# Patient Record
Sex: Female | Born: 1965 | Race: Black or African American | Hispanic: No | Marital: Single | Smoking: Never smoker
Health system: Southern US, Community
[De-identification: ages and names within clinical notes are randomized; demographics above are authoritative.]

---

## 2017-04-30 ENCOUNTER — Emergency Department (HOSPITAL_BASED_OUTPATIENT_CLINIC_OR_DEPARTMENT_OTHER)
Admission: EM | Admit: 2017-04-30 | Discharge: 2017-04-30 | Disposition: A | Discharge status: Home / Self Care | Payer: BC Managed Care – PPO | Attending: Physician Assistant | Admitting: Physician Assistant

## 2017-04-30 ENCOUNTER — Encounter (HOSPITAL_BASED_OUTPATIENT_CLINIC_OR_DEPARTMENT_OTHER): Payer: Self-pay | Admitting: *Deleted

## 2017-04-30 DIAGNOSIS — H5789 Other specified disorders of eye and adnexa: Secondary | ICD-10-CM | POA: Diagnosis not present

## 2017-04-30 MED ORDER — ERYTHROMYCIN 5 MG/GM OP OINT
TOPICAL_OINTMENT | Freq: Once | OPHTHALMIC | Status: AC
  Administered 2017-04-30: 1 via OPHTHALMIC
  Filled 2017-04-30: qty 3.5

## 2017-04-30 NOTE — Discharge Instructions (Signed)
Please follow-up with your ophthalmologist on Monday. You had a small amount of swelling on your lower eyelid.  If you have any increasing swelling, fevers, or pain or difficulty moving your eye please return immediately.  Please use eye ointment 3 times daily, don't use contacts, and continue warm compresses.

## 2017-04-30 NOTE — ED Notes (Signed)
EDP into room 

## 2017-04-30 NOTE — ED Triage Notes (Addendum)
Here for R eye pain, "noticed something wrong on Wednesday, gradually worse over last 2d". Reports "pain only". Saw an eye doctor 2 weeks ago Nilda Riggs(Michael Evans) and was told she had a blocked tear duct, "had some improvement with warm compresses, but warm compresses did not help this time". No meds or drops PTA. (Denies: visual changes, fever, drainage, itching, injury, dizziness, NVD). No icterus or injection. PERRLA, 6mm, brisk bilaterally, swelling present along lower eye lashes/lid. Denies h/o HTN, DM or glaucoma.       Alert, NAD, calm, interactive, resps e/u, speaking in clear complete sentences, no dyspnea noted, skin W&D, VSS.

## 2017-04-30 NOTE — ED Provider Notes (Signed)
[facility] EMERGENCY DEPARTMENT Provider Note   CSN: [id number] Arrival date & time: 04/30/17  0418     History   Chief Complaint Chief Complaint  Patient presents with  . Eye Problem    HPI Julia George is a 51 y.o. female.  HPI   51 year old female presenting with right eye irritation.  Patient reports that she was seen last week and found told to have blocked tear duct.  She used warm compresses which help.  She has similar symptoms in the lower eyelid today.  She noticed that this started 4 days ago.  However she got concerned about it tonight so came here to the emergency department.  No difficulty with vision.  No burning.  No discharge.   History reviewed. No pertinent past medical history.  There are no active problems to display for this patient.   History reviewed. No pertinent surgical history.  OB History    No data available       Home Medications    Prior to Admission medications   Not on File    Family History History reviewed. No pertinent family history.  Social History Social History  Substance Use Topics  . Smoking status: Never Smoker  . Smokeless tobacco: Never Used  . Alcohol use No     Allergies   Patient has no known allergies.   Review of Systems Review of Systems  Constitutional: Negative for activity change.  Respiratory: Negative for shortness of breath.   Cardiovascular: Negative for chest pain.  Gastrointestinal: Negative for abdominal pain.     Physical Exam Updated Vital Signs BP 133/73 (BP Location: Right Arm)   Pulse (!) 48 Comment: "usually low"  Temp 98.2 F (36.8 C) (Oral)   Resp 18   Ht 5\' 7"  (1.702 m)   Wt 121.1 kg (267 lb)   LMP 03/24/2017 (Approximate)   SpO2 99%   BMI 41.82 kg/m   Physical Exam  Constitutional: She is oriented to person, place, and time. She appears well-developed and well-nourished.  HENT:  Head: Normocephalic and atraumatic.  Eyes: Conjunctivae are  normal. Right eye exhibits no discharge.  Bilateral conjunctive are normal.  Extraocular movements intact with no pain.  Subtle mild swelling to the bottom right eyelid.  No evidence of preseptal cellulitis.  Isolated to the lower eyelid.  Cardiovascular:  No murmur heard. Neurological: She is oriented to person, place, and time. No cranial nerve deficit.  Skin: Skin is warm and dry. No rash noted. She is not diaphoretic.  Psychiatric: She has a normal mood and affect. Her behavior is normal.  Nursing note and vitals reviewed.    ED Treatments / Results  Labs (all labs ordered are listed, but only abnormal results are displayed) Labs Reviewed - No data to display  EKG  EKG Interpretation None       Radiology No results found.  Procedures Procedures (including critical care time)  Medications Ordered in ED Medications  erythromycin ophthalmic ointment (not administered)     Initial Impression / Assessment and Plan / ED Course  I have reviewed the triage vital signs and the nursing notes.  Pertinent labs & imaging results that were available during my care of the patient were reviewed by me and considered in my medical decision making (see chart for details).     Patient is a very subtle finding on exam with some mild swelling to the right lower eyelid.  Extraocular movememts intact no pain. Do not think this  represents preseptal cellulitis.  We will give antibiotics in case there is a small local infection.  Suspicious for blepharitis.  We will have her continue to use warm compresses and cleanse her eyelid. Will have her follow with optho on Monday.   Final Clinical Impressions(s) / ED Diagnoses   Final diagnoses:  Eye irritation    New Prescriptions New Prescriptions   No medications on file     Abelino DerrickMackuen, Nik Gorrell Lyn, MD 04/30/17 [phone]

## 2018-08-05 ENCOUNTER — Ambulatory Visit (INDEPENDENT_AMBULATORY_CARE_PROVIDER_SITE_OTHER): Payer: Worker's Compensation

## 2018-08-05 ENCOUNTER — Ambulatory Visit (HOSPITAL_COMMUNITY)
Admission: EM | Admit: 2018-08-05 | Discharge: 2018-08-05 | Disposition: A | Discharge status: Home / Self Care | Payer: Worker's Compensation | Attending: Family Medicine | Admitting: Family Medicine

## 2018-08-05 ENCOUNTER — Encounter (HOSPITAL_COMMUNITY): Payer: Self-pay

## 2018-08-05 ENCOUNTER — Other Ambulatory Visit: Payer: Self-pay

## 2018-08-05 DIAGNOSIS — M79672 Pain in left foot: Secondary | ICD-10-CM | POA: Diagnosis not present

## 2018-08-05 DIAGNOSIS — S93492A Sprain of other ligament of left ankle, initial encounter: Secondary | ICD-10-CM

## 2018-08-05 DIAGNOSIS — M25572 Pain in left ankle and joints of left foot: Secondary | ICD-10-CM

## 2018-08-05 NOTE — ED Triage Notes (Signed)
Thursday morning, pt state she fell going down the step to go and check her bus. Pt cc is left leg pain,  ankle and foot. Pt was at work.

## 2018-08-05 NOTE — ED Provider Notes (Signed)
[facility]   [id number] 08/05/18 Arrival Time: 1417  ASSESSMENT & PLAN:  1. Left foot pain   2. Acute left ankle pain    I have personally viewed the imaging studies ordered this visit. No fractures or dislocations appreciated.  Prefers OTC analgesics. Work note provided.  Rest the injured area as much as practical.  Natural history and expected course discussed. Questions answered. Rest, ice, compression, elevation (RICE) therapy. Fit with ankle brace for use over next 1 week.  Follow-up Information    Schedule an appointment as soon as possible for a visit  with [facility].         Information and phone number given.  Reviewed expectations re: course of current medical issues. Questions answered. Outlined signs and symptoms indicating need for more acute intervention. Patient verbalized understanding. After Visit Summary given.  SUBJECTIVE: History from: patient. Julia George is a 53 y.o. female who reports persistent mild to moderate pain of her left ankle and foot; described as aching without radiation. Onset: abrupt, 2 days ago while at work. Injury/trama: yes, reports tripping and twisting her L ankle/foot on stairs. Able to bear weight immediately and since with some discomfort. Symptoms have progressed to a point and plateaued since beginning. Aggravating factors: movement. Alleviating factors: rest. Associated symptoms: none reported. Extremity sensation changes or weakness: none. Self treatment: Tylenol with some relief. History of similar: no.  History reviewed. No pertinent surgical history.   ROS: As per HPI. All other systems negative.  OBJECTIVE:  Vitals:   08/05/18 1442 08/05/18 1445  BP:  139/76  Resp:  18  Temp:  98.3 F (36.8 C)  TempSrc:  Oral  SpO2:  99%  Weight: 122.5 kg     General appearance: alert; no distress HEENT: [place]; AT Neck: supple with FROM Extremities: . LLE: warm and well perfused;  poorly localized moderate tenderness over left lateral foot and ankle; without gross deformities; with mild swelling; with no bruising; ROM: normal but with reported discomfort CV: brisk extremity capillary refill of LLE; 2+ DP and PT pulse of LLE. Abd: soft; non-tender Skin: warm and dry; no visible rashes Neurologic: gait normal; normal reflexes of RLE and LLE; normal sensation of RLE and LLE; normal strength of RLE and LLE Psychological: alert and cooperative; normal mood and affect  No Known Allergies  Imaging: Dg Ankle Complete Left  Result Date: 08/05/2018 CLINICAL DATA:  Per pt: fell this past Thursday, injured the left foot. Pain is the left foot, lateral, anterior to the os calcis. No prior injury to the left foot. Patient is not a diabetic. EXAM: LEFT ANKLE COMPLETE - 3+ VIEW COMPARISON:  None. FINDINGS: There is no evidence of fracture, dislocation, or joint effusion. There is no evidence of arthropathy or other focal bone abnormality. Soft tissues are unremarkable. IMPRESSION: Negative. Electronically Signed   By: Amie Portlandavid  Ormond M.D.   On: 08/05/2018 15:22   Dg Foot Complete Left  Result Date: 08/05/2018 CLINICAL DATA:  Per pt: fell this past Thursday, injured the left foot. Pain is the left foot, lateral, anterior to the os calcis. No prior injury to the left foot. Patient is not a diabetic. EXAM: LEFT FOOT - COMPLETE 3+ VIEW COMPARISON:  None. FINDINGS: There is no evidence of fracture or dislocation. There is no evidence of arthropathy or other focal bone abnormality. Soft tissues are unremarkable. IMPRESSION: Negative. Electronically Signed   By: Amie Portlandavid  Ormond M.D.   On: 08/05/2018 15:21   PMH:  No h/o ankle or foot pain/injury.  Social History   Socioeconomic History  . Marital status: Single    Spouse name: Not on file  . Number of children: Not on file  . Years of education: Not on file  . Highest education level: Not on file  Occupational History  . Not on file  Social  Needs  . Financial resource strain: Not on file  . Food insecurity:    Worry: Not on file    Inability: Not on file  . Transportation needs:    Medical: Not on file    Non-medical: Not on file  Tobacco Use  . Smoking status: Never Smoker  . Smokeless tobacco: Never Used  Substance and Sexual Activity  . Alcohol use: No  . Drug use: No  . Sexual activity: Not on file  Lifestyle  . Physical activity:    Days per week: Not on file    Minutes per session: Not on file  . Stress: Not on file  Relationships  . Social connections:    Talks on phone: Not on file    Gets together: Not on file    Attends religious service: Not on file    Active member of club or organization: Not on file    Attends meetings of clubs or organizations: Not on file    Relationship status: Not on file  Other Topics Concern  . Not on file  Social History Narrative  . Not on file   FH: HTN.  History reviewed. No pertinent surgical history.    Mardella Layman, MD 08/16/18 1145

## 2018-08-24 ENCOUNTER — Encounter (HOSPITAL_COMMUNITY): Payer: Self-pay | Admitting: Emergency Medicine

## 2018-08-24 ENCOUNTER — Emergency Department (HOSPITAL_BASED_OUTPATIENT_CLINIC_OR_DEPARTMENT_OTHER)
Admit: 2018-08-24 | Discharge: 2018-08-24 | Disposition: A | Discharge status: Home / Self Care | Payer: BC Managed Care – PPO | Attending: Emergency Medicine | Admitting: Emergency Medicine

## 2018-08-24 ENCOUNTER — Other Ambulatory Visit: Payer: Self-pay

## 2018-08-24 ENCOUNTER — Ambulatory Visit (HOSPITAL_COMMUNITY)
Admission: EM | Admit: 2018-08-24 | Discharge: 2018-08-24 | Disposition: A | Discharge status: Home / Self Care | Payer: Worker's Compensation | Attending: Internal Medicine | Admitting: Internal Medicine

## 2018-08-24 ENCOUNTER — Emergency Department (HOSPITAL_COMMUNITY)
Admission: EM | Admit: 2018-08-24 | Discharge: 2018-08-24 | Disposition: A | Discharge status: Home / Self Care | Payer: BC Managed Care – PPO | Attending: Emergency Medicine | Admitting: Emergency Medicine

## 2018-08-24 DIAGNOSIS — M7989 Other specified soft tissue disorders: Secondary | ICD-10-CM | POA: Diagnosis not present

## 2018-08-24 DIAGNOSIS — M79662 Pain in left lower leg: Secondary | ICD-10-CM | POA: Diagnosis not present

## 2018-08-24 DIAGNOSIS — Y33XXXD Other specified events, undetermined intent, subsequent encounter: Secondary | ICD-10-CM | POA: Insufficient documentation

## 2018-08-24 DIAGNOSIS — S8992XD Unspecified injury of left lower leg, subsequent encounter: Secondary | ICD-10-CM | POA: Insufficient documentation

## 2018-08-24 DIAGNOSIS — R52 Pain, unspecified: Secondary | ICD-10-CM

## 2018-08-24 NOTE — ED Triage Notes (Signed)
Pt complains of left calf pain and possible blood clot. Pt has no history of blood clots.

## 2018-08-24 NOTE — ED Provider Notes (Signed)
[facility]    CSN: [id number] Arrival date & time: 08/24/18  1632     History   Chief Complaint Chief Complaint  Patient presents with  . Leg Pain    HPI Julia George is a 53 y.o. female who presents to the UC with left calf pain. Patient reports falling about a month ago and was treated with a Cam Walker. She had follow up with her Worker's Comp doctor and all symptoms seemed to resolve. Patient was released last week but now she has developed pain and swelling in the left calf. Patient denies shortness of breath or chest pain.   HPI  History reviewed. No pertinent past medical history.  There are no active problems to display for this patient.   History reviewed. No pertinent surgical history.  OB History   No obstetric history on file.      Home Medications    Prior to Admission medications   Not on File    Family History No family history on file.  Social History Social History   Tobacco Use  . Smoking status: Never Smoker  . Smokeless tobacco: Never Used  Substance Use Topics  . Alcohol use: No  . Drug use: No     Allergies   Patient has no known allergies.   Review of Systems Review of Systems   Physical Exam Triage Vital Signs ED Triage Vitals  Enc Vitals Group     BP 08/24/18 1703 (!) 179/91     Pulse Rate 08/24/18 1703 98     Resp 08/24/18 1703 18     Temp 08/24/18 1703 98.1 F (36.7 C)     Temp Source 08/24/18 1703 Oral     SpO2 08/24/18 1703 100 %     Weight --      Height --      Head Circumference --      Peak Flow --      Pain Score 08/24/18 1724 4     Pain Loc --      Pain Edu? --      Excl. in GC? --    No data found.  Updated Vital Signs BP (!) 179/91 (BP Location: Left Arm)   Pulse 98   Temp 98.1 F (36.7 C) (Oral)   Resp 18   SpO2 100%   Physical Exam Vitals signs and nursing note reviewed.  Constitutional:      General: She is not in acute distress.    Appearance: She is  well-developed.  HENT:     Mouth/Throat:     Mouth: Mucous membranes are moist.  Eyes:     Conjunctiva/sclera: Conjunctivae normal.  Neck:     Musculoskeletal: Neck supple.  Cardiovascular:     Rate and Rhythm: Normal rate.  Pulmonary:     Effort: Pulmonary effort is normal.  Musculoskeletal:     Left lower leg: She exhibits tenderness and swelling. She exhibits no deformity and no laceration.  Skin:    General: Skin is warm and dry.  Neurological:     Mental Status: She is alert and oriented to person, place, and time.  Psychiatric:        Mood and Affect: Mood normal.      UC Treatments / Results  Labs (all labs ordered are listed, but only abnormal results are displayed) Labs Reviewed - No data to display  Radiology No results found.  Procedures Procedures (including critical care time)  Medications Ordered in UC Medications -  No data to display  Initial Impression / Assessment and Plan / UC Course  I have reviewed the triage vital signs and the nursing notes. 53 y.o. female here with left calf pain stable for d/c to go to the ED for further evaluation. No chest pain or shortness of breath. We do not have ultrasound availability or Lovenox here at Urgent Care. Patient voices understanding and will go to the ED.  Final Clinical Impressions(s) / UC Diagnoses   Final diagnoses:  Pain of left calf     Discharge Instructions     Go to the [facility] ED for further evaluation of possible blood clot of your left calf.     ED Prescriptions    None     Controlled Substance Prescriptions [place] Controlled Substance Registry consulted? Not Applicable   Kerrie [place], [place] 08/24/18 [phone]

## 2018-08-24 NOTE — Discharge Instructions (Addendum)
Go to the [facility] ED for further evaluation of possible blood clot of your left calf.

## 2018-08-24 NOTE — Discharge Instructions (Addendum)
Get help right away if: °You develop shortness of breath or chest pain. °You cannot breathe when you lie down. °You develop pain, redness, or warmth in the swollen areas. °You have heart, liver, or kidney disease and suddenly get edema. °You have a fever and your symptoms suddenly get worse. °

## 2018-08-24 NOTE — Progress Notes (Signed)
Left lower extremity venous duplex has been completed. Preliminary results can be found in CV Proc through chart review.  Results were given to Arthor Captain PA.  08/24/18 6:38 PM Olen Cordial RVT

## 2018-08-24 NOTE — ED Triage Notes (Signed)
Pt was seen here 2/1 and diagnosed with a sprained ankle. PT reports her calf was swollen at that time. Swelling in calf has worsened and it's now painful.

## 2018-08-24 NOTE — ED Provider Notes (Signed)
[facility] EMERGENCY DEPARTMENT Provider Note   CSN: [id number] Arrival date & time: 08/24/18  1809    History   Chief Complaint No chief complaint on file.   HPI Julia George is a 53 y.o. female   Emergency department sent from urgent care with left calf pain and swelling.  The patient was recently seen for left leg injury and placed in cam walker.  She had resolution of her symptoms however she noticed that over the past few days coming out of her cam walker she has had increasing swelling in the leg and pain on the lateral side.  She denies chest pain or shortness of breath.  She is not taking anything for her pain.  She denies numbness or tingling.  She was sent for rule out of DVT.   HPI  History reviewed. No pertinent past medical history.  There are no active problems to display for this patient.   History reviewed. No pertinent surgical history.   OB History   No obstetric history on file.      Home Medications    Prior to Admission medications   Not on File    Family History No family history on file.  Social History Social History   Tobacco Use  . Smoking status: Never Smoker  . Smokeless tobacco: Never Used  Substance Use Topics  . Alcohol use: No  . Drug use: No     Allergies   Patient has no known allergies.   Review of Systems Review of Systems  Ten systems reviewed and are negative for acute change, except as noted in the HPI.   Physical Exam Updated Vital Signs BP 132/73 (BP Location: Left Arm)   Pulse (!) 50   Temp 97.6 F (36.4 C) (Oral)   Resp 16   Ht 5\' 7"  (1.702 m)   Wt 122.5 kg   SpO2 100%   BMI 42.29 kg/m   Physical Exam Vitals signs and nursing note reviewed.  Constitutional:      General: She is not in acute distress.    Appearance: She is well-developed. She is not diaphoretic.  HENT:     Head: Normocephalic and atraumatic.  Eyes:     General: No scleral icterus.  Conjunctiva/sclera: Conjunctivae normal.  Neck:     Musculoskeletal: Normal range of motion.  Cardiovascular:     Rate and Rhythm: Normal rate and regular rhythm.     Heart sounds: Normal heart sounds. No murmur. No friction rub. No gallop.   Pulmonary:     Effort: Pulmonary effort is normal. No respiratory distress.     Breath sounds: Normal breath sounds.  Abdominal:     General: Bowel sounds are normal. There is no distension.     Palpations: Abdomen is soft. There is no mass.     Tenderness: There is no abdominal tenderness. There is no guarding.  Skin:    General: Skin is warm and dry.  Neurological:     Mental Status: She is alert and oriented to person, place, and time.  Psychiatric:        Behavior: Behavior normal.      ED Treatments / Results  Labs (all labs ordered are listed, but only abnormal results are displayed) Labs Reviewed - No data to display  EKG None  Radiology No results found.  Procedures Procedures (including critical care time)  Medications Ordered in ED Medications - No data to display   Initial Impression / Assessment and  Plan / ED Course  I have reviewed the triage vital signs and the nursing notes.  Pertinent labs & imaging results that were available during my care of the patient were reviewed by me and considered in my medical decision making (see chart for details).        Patient without obvious unilateral leg swelling.  She has a negative ultrasound for DVT no evidence of Baker's cyst.  She has normal DP and PT pulses, sensation bilaterally.  She is full range of motion at the hip knee and ankle joint.  Patient will be discharged to follow-up with her Worker's Comp. assigned physicians.  She may take Tylenol or Motrin for pain.  Does not appear to be an emergent cause of her swelling.  She was appropriate for discharge at this time.  Final Clinical Impressions(s) / ED Diagnoses   Final diagnoses:  Left leg swelling    ED  Discharge Orders    None       Arthor Captain, PA-C 08/24/18 2350    Jacalyn Lefevre, MD 08/24/18 [phone]

## 2019-11-16 ENCOUNTER — Ambulatory Visit: Payer: BC Managed Care – PPO | Attending: Internal Medicine

## 2019-11-16 DIAGNOSIS — Z23 Encounter for immunization: Secondary | ICD-10-CM

## 2019-11-16 NOTE — Progress Notes (Signed)
   Covid-19 Vaccination Clinic  Name:  Julia George    MRN: [id number] DOB: January 18, 1966  11/16/2019  Julia George was observed post Covid-19 immunization for 15 minutes without incident. She was provided with Vaccine Information Sheet and instruction to access the V-Safe system.   Julia George was instructed to call 911 with any severe reactions post vaccine: Marland Kitchen Difficulty breathing  . Swelling of face and throat  . A fast heartbeat  . A bad rash all over body  . Dizziness and weakness   Immunizations Administered    Name Date Dose VIS Date Route   Pfizer COVID-19 Vaccine 11/16/2019  1:37 PM 0.3 mL 08/29/2018 Intramuscular   Manufacturer: [employer], [employer]   Lot: [id number]   NDC: [address]-1

## 2019-12-07 ENCOUNTER — Ambulatory Visit: Payer: BC Managed Care – PPO | Attending: Internal Medicine

## 2019-12-07 DIAGNOSIS — Z23 Encounter for immunization: Secondary | ICD-10-CM

## 2019-12-07 NOTE — Progress Notes (Signed)
   Covid-19 Vaccination Clinic  Name:  Julia George    MRN: [id number] DOB: 13-Jun-1966  12/07/2019  Julia George was observed post Covid-19 immunization for 15 minutes without incident. She was provided with Vaccine Information Sheet and instruction to access the V-Safe system.   Ms. Christy was instructed to call 911 with any severe reactions post vaccine: Marland Kitchen Difficulty breathing  . Swelling of face and throat  . A fast heartbeat  . A bad rash all over body  . Dizziness and weakness   Immunizations Administered    Name Date Dose VIS Date Route   Pfizer COVID-19 Vaccine 12/07/2019 10:15 AM 0.3 mL 08/29/2018 Intramuscular   Manufacturer: [employer], [employer]   Lot: [id number]   NDC: [address]-7

## 2020-02-27 IMAGING — DX DG ANKLE COMPLETE 3+V*L*
3 series · 3 of 3 positions shown · non-contrast
Comparison: None.

CLINICAL DATA: Per pt: fell this past [REDACTED], injured the left
foot. Pain is the left foot, lateral, anterior to the os calcis. No
prior injury to the left foot. Patient is not a diabetic.

EXAM:
LEFT ANKLE COMPLETE - 3+ VIEW

[ankle ap]
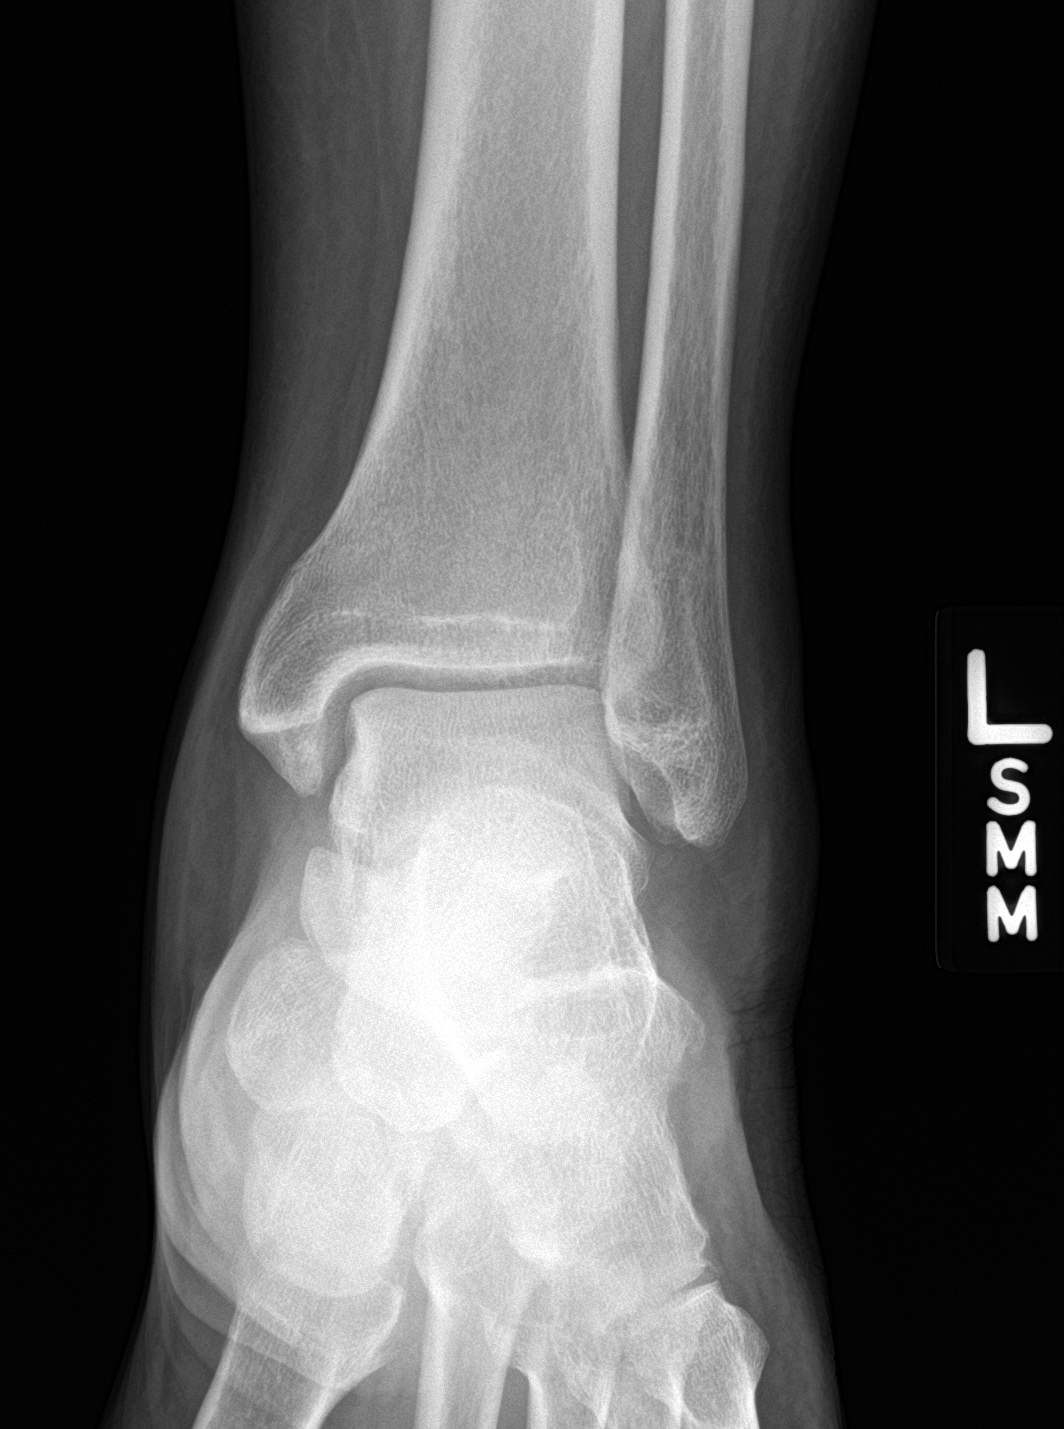

[ankle obl]
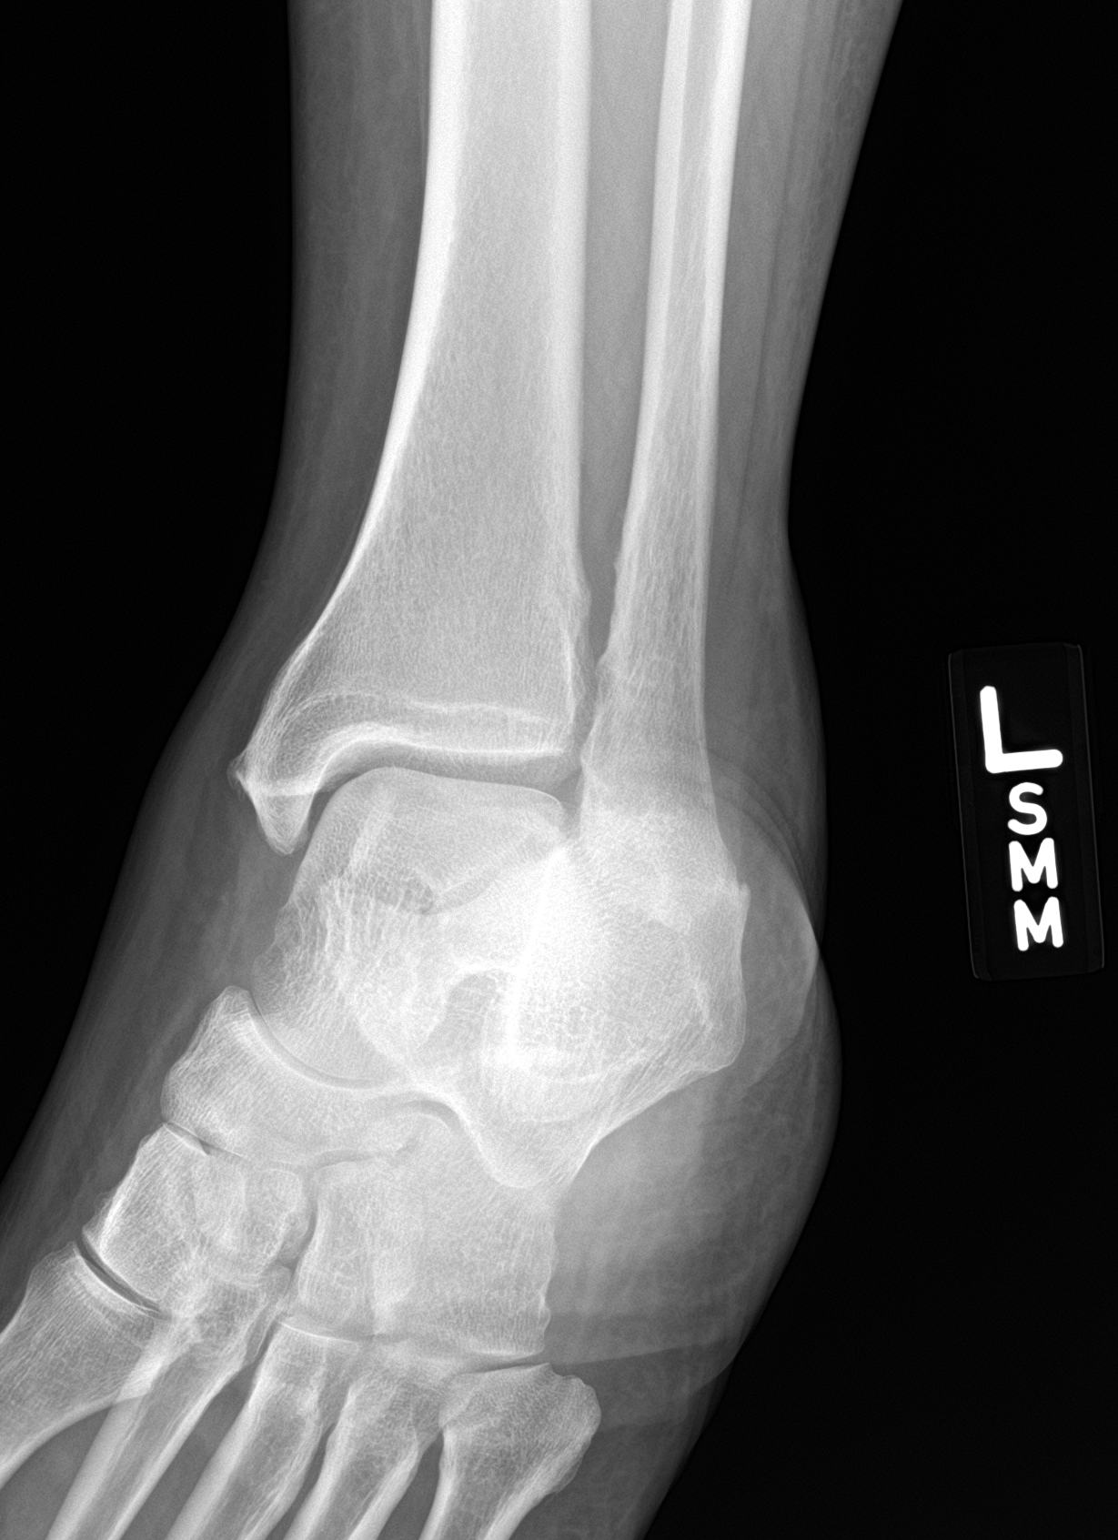

[ankle lat]
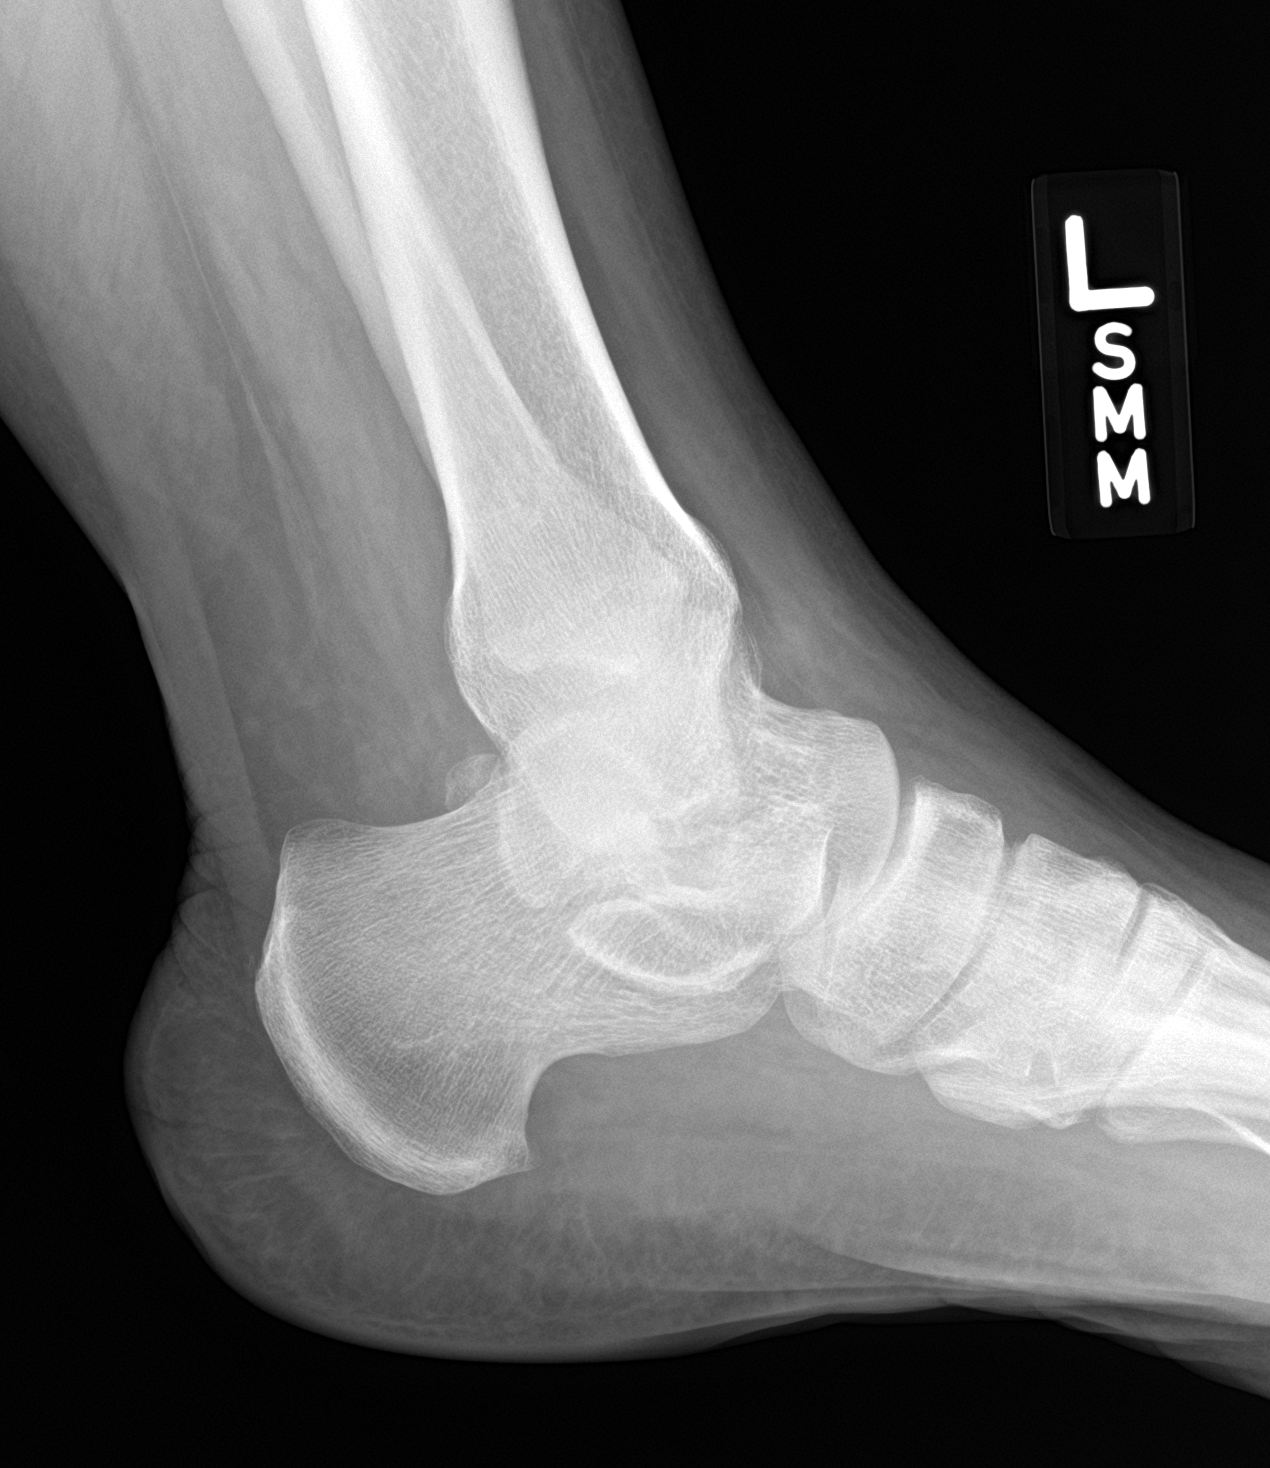

[3 of 3 positions shown; findings below may reference images not displayed]

FINDINGS: There is no evidence of fracture, dislocation, or joint effusion.
There is no evidence of arthropathy or other focal bone abnormality.
Soft tissues are unremarkable.
IMPRESSION: Negative.

## 2020-04-09 ENCOUNTER — Ambulatory Visit: Payer: BC Managed Care – PPO | Admitting: Registered"

## 2021-04-30 ENCOUNTER — Other Ambulatory Visit (HOSPITAL_BASED_OUTPATIENT_CLINIC_OR_DEPARTMENT_OTHER): Payer: Self-pay | Admitting: Internal Medicine

## 2021-04-30 DIAGNOSIS — Z1231 Encounter for screening mammogram for malignant neoplasm of breast: Secondary | ICD-10-CM

## 2021-06-09 ENCOUNTER — Ambulatory Visit (HOSPITAL_BASED_OUTPATIENT_CLINIC_OR_DEPARTMENT_OTHER): Payer: BC Managed Care – PPO

## 2021-06-12 ENCOUNTER — Telehealth (HOSPITAL_BASED_OUTPATIENT_CLINIC_OR_DEPARTMENT_OTHER): Payer: Self-pay

## 2021-07-03 ENCOUNTER — Telehealth (HOSPITAL_BASED_OUTPATIENT_CLINIC_OR_DEPARTMENT_OTHER): Payer: Self-pay

## 2021-08-05 ENCOUNTER — Ambulatory Visit: Payer: 59 | Admitting: Cardiology

## 2021-08-05 ENCOUNTER — Encounter: Payer: Self-pay | Admitting: Cardiology

## 2021-08-05 ENCOUNTER — Other Ambulatory Visit: Payer: Self-pay

## 2021-08-05 VITALS — BP 145/88 | HR 55 | Temp 97.6°F | Resp 16 | Ht 67.0 in | Wt 295.2 lb

## 2021-08-05 DIAGNOSIS — R030 Elevated blood-pressure reading, without diagnosis of hypertension: Secondary | ICD-10-CM

## 2021-08-05 DIAGNOSIS — Z6841 Body Mass Index (BMI) 40.0 and over, adult: Secondary | ICD-10-CM

## 2021-08-05 DIAGNOSIS — R600 Localized edema: Secondary | ICD-10-CM

## 2021-08-05 NOTE — Progress Notes (Signed)
Primary Physician/Referring:  Jackie Plum, MD  Patient ID: Julia George, female    DOB: [birth year], 56 y.o.   MRN: [id number]  Chief Complaint  Patient presents with   Edema   New Patient (Initial Visit)    Referred by Alcide Clever, MD   HPI:    Julia George  is a 56 y.o. with no significant prior cardiovascular history, referred to me for evaluation of leg edema.  History reviewed. No pertinent past medical history. History reviewed. No pertinent surgical history. Family History  Problem Relation Age of Onset   Diabetes Father    Hypertension Brother    Diabetes Brother    Diabetes Brother     Social History   Tobacco Use   Smoking status: Never   Smokeless tobacco: Never  Substance Use Topics   Alcohol use: No   Marital Status: Single  ROS  Review of Systems  Cardiovascular:  Negative for chest pain, dyspnea on exertion and leg swelling.  Objective  Blood pressure (!) 145/88, pulse (!) 55, temperature 97.6 F (36.4 C), temperature source Temporal, resp. rate 16, height 5\' 7"  (1.702 m), weight 295 lb 3.2 oz (133.9 kg), SpO2 100 %. Body mass index is 46.23 kg/m.  Vitals with BMI 08/05/2021 08/05/2021 08/24/2018  Height - 5\' 7"  -  Weight - 295 lbs 3 oz -  BMI - 46.22 -  Systolic 145 159 08/26/2018  Diastolic 88 81 73  Pulse - 55 50    Physical Exam Constitutional:      Appearance: She is morbidly obese.  Neck:     Vascular: No carotid bruit or JVD.  Cardiovascular:     Rate and Rhythm: Normal rate and regular rhythm.     Pulses: Intact distal pulses.     Heart sounds: Normal heart sounds. No murmur heard.   No gallop.  Pulmonary:     Effort: Pulmonary effort is normal.     Breath sounds: Normal breath sounds.  Abdominal:     General: Bowel sounds are normal.     Palpations: Abdomen is soft.  Musculoskeletal:     Right lower leg: No edema.     Left lower leg: No edema.     Medications and allergies  No Known Allergies   Medication prior  to this encounter:   Outpatient Medications Prior to Visit  Medication Sig Dispense Refill   prednisoLONE acetate (PRED FORTE) 1 % ophthalmic suspension 1 drop 3 (three) times daily.     No facility-administered medications prior to visit.     Medication list after today's encounter   Current Outpatient Medications  Medication Instructions   prednisoLONE acetate (PRED FORTE) 1 % ophthalmic suspension 1 drop, 3 times daily    Laboratory examination:   No results for input(s): NA, K, CL, CO2, GLUCOSE, BUN, CREATININE, CALCIUM, GFRNONAA, GFRAA in the last 8760 hours. CrCl cannot be calculated (No successful lab value found.).  No flowsheet data found. No flowsheet data found.  Lipid Panel No results for input(s): CHOL, TRIG, LDLCALC, VLDL, HDL, CHOLHDL, LDLDIRECT in the last 8760 hours. Lipid Panel  No results found for: CHOL, TRIG, HDL, CHOLHDL, VLDL, LDLCALC, LDLDIRECT, LABVLDL   HEMOGLOBIN A1C No results found for: HGBA1C, MPG TSH No results for input(s): TSH in the last 8760 hours.  Radiology:   No results found.  Cardiac Studies:   Lower Extremity Venous Duplex  left 08/24/2018: Right: No evidence of common femoral vein obstruction.  Left: There is no evidence of deep  vein thrombosis in the lower extremity.  No cystic structure found in the popliteal fossa.   EKG:   EKG 08/05/2021: Sinus bradycardia at rate of 49 bpm, otherwise normal EKG.    Assessment     ICD-10-CM   1. Bilateral leg edema  R60.0 EKG 12-Lead    2. Class 3 severe obesity due to excess calories without serious comorbidity with body mass index (BMI) of 45.0 to 49.9 in adult (HCC)  E66.01    Z68.42     3. Elevated blood pressure reading without diagnosis of hypertension  R03.0        There are no discontinued medications.  No orders of the defined types were placed in this encounter.  Orders Placed This Encounter  Procedures   EKG 12-Lead   Recommendations:   Julia George is a  56 y.o. Patient Race: Black or African American [2] female with no significant prior cardiovascular history, referred to me for evaluation of leg edema.  Vascular examination does not reveal any evidence of venous insufficiency or varicose veins.  Her leg edema is related to morbid obesity.  I have counseled her regarding obesity, calorie restrictions, and regular exercise.  She may want to consider joining a program.  Her blood pressure is also elevated today, again probably related to obesity, patient states that during recent visit with her PCP, blood pressure was well controlled.  Advised her to continue to watch the blood pressure and to follow-up with her PCP regarding this.  External labs reviewed, she has normal renal function, normal CBC and TSH.  This is from 2021, I do not have a lipid profile.  I will see her back on a as needed basis.  In view of normal physical exam except for obesity and normal EKG, patient being asymptomatic without chest pain or dyspnea, no further cardiac evaluation is indicated.  She appears to be motivated in weight loss.    Yates Decamp, MD, [facility] 08/05/2021, 9:32 AM Office: [phone]

## 2024-06-27 ENCOUNTER — Encounter (HOSPITAL_BASED_OUTPATIENT_CLINIC_OR_DEPARTMENT_OTHER): Payer: Self-pay | Admitting: Emergency Medicine

## 2024-06-27 ENCOUNTER — Emergency Department (HOSPITAL_BASED_OUTPATIENT_CLINIC_OR_DEPARTMENT_OTHER)

## 2024-06-27 ENCOUNTER — Emergency Department (HOSPITAL_BASED_OUTPATIENT_CLINIC_OR_DEPARTMENT_OTHER)
Admission: EM | Admit: 2024-06-27 | Discharge: 2024-06-27 | Disposition: A | Discharge status: Home / Self Care | Attending: Emergency Medicine | Admitting: Emergency Medicine

## 2024-06-27 ENCOUNTER — Other Ambulatory Visit: Payer: Self-pay

## 2024-06-27 DIAGNOSIS — J101 Influenza due to other identified influenza virus with other respiratory manifestations: Secondary | ICD-10-CM | POA: Diagnosis not present

## 2024-06-27 DIAGNOSIS — R509 Fever, unspecified: Secondary | ICD-10-CM | POA: Diagnosis present

## 2024-06-27 LAB — RESP PANEL BY RT-PCR (RSV, FLU A&B, COVID)  RVPGX2
Influenza A by PCR: POSITIVE — AB
Influenza B by PCR: NEGATIVE
Resp Syncytial Virus by PCR: NEGATIVE
SARS Coronavirus 2 by RT PCR: NEGATIVE

## 2024-06-27 MED ORDER — ACETAMINOPHEN 500 MG PO TABS
1000.0000 mg | ORAL_TABLET | Freq: Once | ORAL | Status: AC
  Administered 2024-06-27: 1000 mg via ORAL
  Filled 2024-06-27: qty 2

## 2024-06-27 MED ORDER — BENZONATATE 100 MG PO CAPS: 100.0000 mg | ORAL_CAPSULE | Freq: Three times a day (TID) | ORAL | 0 refills | Status: AC | PRN

## 2024-06-27 MED ORDER — BENZONATATE 100 MG PO CAPS
100.0000 mg | ORAL_CAPSULE | Freq: Once | ORAL | Status: AC
  Administered 2024-06-27: 100 mg via ORAL
  Filled 2024-06-27: qty 1

## 2024-06-27 NOTE — Discharge Instructions (Addendum)
 It was a pleasure taking care of you today.  Based on your history, physical exam, as well as your labs and imaging I feel you are safe for discharge.  Today you tested positive for influenza A.  The flu is a virus and symptoms typically last 7 to 14 days.  You may take over-the-counter Tylenol  and ibuprofen to help with your symptoms.  I have also sent in a prescription for Tessalon  Perles which is a cough medication which you may take as needed for cough.  Also recommend over-the-counter Flonase and Zyrtec or Claritin to help with congestion and allergy-like symptoms.  Today your chest x-ray was reassuring with no sign of pneumonia.  If you experience any of the following symptoms including but not limited to refractory fever, chest pain, shortness of breath, weakness, or other concerning symptom please return to the emergency department or seek further medical care.  Please maintain good hand hygiene and limit contacts as the flu is contagious.  Please make your primary care provider aware of your workup and all findings today. Recommend follow-up in 1-2 weeks as needed with primary care. If symptoms worsen recommend follow-up within 48 hours.

## 2024-06-27 NOTE — ED Provider Notes (Signed)
 "  EMERGENCY DEPARTMENT AT [facility] Provider Note   CSN: [id number] Arrival date & time: 06/27/24  1501     Patient presents with: Fever   Julia George is a 58 y.o. female who presents to the ED with a chief complaint of fever x 1 week as well as cough and congestion. Patient states that she has been experiencing a cough off and on for approximately 1 month. Patient unsure of exact known sick contacts however is a school bus driver for all ages. Denies chest pain, shortness of breath, throat pain, abdominal pain, urinary symptoms. Patient states she has no significant past medical history and takes no prescription medications at home. Patient states that she does live with her 56 year old father.     Fever      Prior to Admission medications  Medication Sig Start Date End Date Taking? Authorizing Provider  benzonatate  (TESSALON ) 100 MG capsule Take 1 capsule (100 mg total) by mouth 3 (three) times daily as needed for cough. 06/27/24  Yes Harris Penton F, PA-C  prednisoLONE acetate (PRED FORTE) 1 % ophthalmic suspension 1 drop 3 (three) times daily. 07/17/21   [provider]    Allergies: Patient has no known allergies.    Review of Systems  Constitutional:  Positive for fever.    Updated Vital Signs BP 112/72 (BP Location: Right Arm)   Pulse 62   Temp 98.1 F (36.7 C) (Oral)   Resp 16   Ht 5' 7 (1.702 m)   Wt 129.3 kg   LMP 03/24/2017   SpO2 100%   BMI 44.64 kg/m   Physical Exam Vitals and nursing note reviewed.  Constitutional:      General: She is not in acute distress.    Appearance: Normal appearance. She is not ill-appearing, toxic-appearing or diaphoretic.  HENT:     Head: Normocephalic and atraumatic.     Right Ear: Tympanic membrane, ear canal and external ear normal. There is no impacted cerumen.     Left Ear: Tympanic membrane, ear canal and external ear normal. There is no impacted cerumen.     Nose: Rhinorrhea  present.     Mouth/Throat:     Mouth: Mucous membranes are moist.     Pharynx: No oropharyngeal exudate or posterior oropharyngeal erythema.     Comments: Patient talking in full sentences on room air Eyes:     General: No scleral icterus.       Right eye: No discharge.        Left eye: No discharge.  Neck:     Comments: Patient able to look left, right, touch chin to chest, and look up the ceiling without significant discomfort Cardiovascular:     Rate and Rhythm: Normal rate and regular rhythm.  Pulmonary:     Effort: Pulmonary effort is normal. No respiratory distress.     Breath sounds: Normal breath sounds. No stridor. No wheezing, rhonchi or rales.  Musculoskeletal:        General: Normal range of motion.     Cervical back: Normal range of motion.     Right lower leg: No edema.     Left lower leg: No edema.     Comments: Grossly normal ROM of all 4 extremities, patient ambulatory without assistance  Skin:    General: Skin is warm.     Capillary Refill: Capillary refill takes less than 2 seconds.  Neurological:     General: No focal deficit present.  Mental Status: She is alert and oriented to person, place, and time.  Psychiatric:        Mood and Affect: Mood normal.        Behavior: Behavior normal.     (all labs ordered are listed, but only abnormal results are displayed) Labs Reviewed  RESP PANEL BY RT-PCR (RSV, FLU A&B, COVID)  RVPGX2 - Abnormal; Notable for the following components:      Result Value   Influenza A by PCR POSITIVE (*)    All other components within normal limits    EKG: None  Radiology: DG Chest 2 View Result Date: 06/27/2024 CLINICAL DATA:  Fever with runny nose and cough. EXAM: CHEST - 2 VIEW COMPARISON:  None Available. FINDINGS: The heart size and mediastinal contours are within normal limits. Both lungs are clear. The visualized skeletal structures are unremarkable. IMPRESSION: No active cardiopulmonary disease. Electronically Signed    By: Suzen Dials M.D.   On: 06/27/2024 16:03     Procedures   Medications Ordered in the ED  acetaminophen  (TYLENOL ) tablet 1,000 mg (1,000 mg Oral Given 06/27/24 1802)  benzonatate  (TESSALON ) capsule 100 mg (100 mg Oral Given 06/27/24 1803)                                    Medical Decision Making Amount and/or Complexity of Data Reviewed Radiology: ordered.  Risk OTC drugs. Prescription drug management.   Patient presents to the ED for concern of body aches, cough, fever this involves an extensive number of treatment options, and is a complaint that carries with it a high risk of complications and morbidity.  The differential diagnosis includes viral syndrome including covid, flu, rsv, pneumonia, sinusitis, etc.   Co morbidities that complicate the patient evaluation  none   Lab Tests:  I Ordered, and personally interpreted labs.  The pertinent results include:  Respiratory panel positive for influenza A   Imaging Studies ordered:  I ordered imaging studies including CXR I independently visualized and interpreted imaging which showed no acute cardiopulmonary abnormality I agree with the radiologist interpretation  Medicines ordered and prescription drug management:  I ordered medication including Tessalon  for couch, tylenol  for pain Reevaluation of the patient after these medicines showed that the patient improved I have reviewed the patients home medicines and have made adjustments as needed   Test Considered:  none   Critical Interventions:  none   Problem List / ED Course:  59 year old female, vital signs stable, URI like symptoms  Respiratory panel ordered from triage and positive for influenza A Physical exam reassuring, patient appears non-toxic, no shortness of breath, wheezing, rales, or rhonchi, no stiff neck Discussed workup and findings including negative CXR Patient understanding and will pick up meds from the pharmacy Return  precautions given  Patient discharged  Considered Tamiflu however patient has limited co morbidities, patient is also on roughly day 6 of symptoms Most likely diagnosis at this time is ongoing uncomplicated influenza A infection   Reevaluation:  After the interventions noted above, I reevaluated the patient and found that they have :improved   Social Determinants of Health:  None   Dispostion:  After consideration of the diagnostic results and the patients response to treatment, I feel that the patent would benefit from discharge and outpatient therapy as described.      Final diagnoses:  Influenza A    ED Discharge Orders  Ordered    benzonatate  (TESSALON ) 100 MG capsule  3 times daily PRN        06/27/24 1829               Treyson Axel F, PA-C 06/27/24 2314  "

## 2024-06-27 NOTE — ED Triage Notes (Signed)
 Pt c/o fever x 1 week and runny nose/cough x 1 month. Pt drives a school bus.

## 2024-07-10 ENCOUNTER — Encounter (HOSPITAL_BASED_OUTPATIENT_CLINIC_OR_DEPARTMENT_OTHER): Payer: Self-pay

## 2024-07-10 ENCOUNTER — Other Ambulatory Visit: Payer: Self-pay

## 2024-07-10 ENCOUNTER — Emergency Department (HOSPITAL_BASED_OUTPATIENT_CLINIC_OR_DEPARTMENT_OTHER)
Admission: EM | Admit: 2024-07-10 | Discharge: 2024-07-10 | Disposition: A | Discharge status: Home / Self Care | Attending: Emergency Medicine | Admitting: Emergency Medicine

## 2024-07-10 DIAGNOSIS — T7849XA Other allergy, initial encounter: Secondary | ICD-10-CM

## 2024-07-10 DIAGNOSIS — L03811 Cellulitis of head [any part, except face]: Secondary | ICD-10-CM | POA: Insufficient documentation

## 2024-07-10 DIAGNOSIS — L234 Allergic contact dermatitis due to dyes: Secondary | ICD-10-CM | POA: Insufficient documentation

## 2024-07-10 DIAGNOSIS — R21 Rash and other nonspecific skin eruption: Secondary | ICD-10-CM | POA: Diagnosis present

## 2024-07-10 MED ORDER — DOXYCYCLINE HYCLATE 100 MG PO CAPS: 100.0000 mg | ORAL_CAPSULE | Freq: Two times a day (BID) | ORAL | 0 refills | Status: AC

## 2024-07-10 MED ORDER — BACITRACIN ZINC 500 UNIT/GM EX OINT: 1.0000 | TOPICAL_OINTMENT | Freq: Two times a day (BID) | CUTANEOUS | 0 refills | Status: AC

## 2024-07-10 NOTE — ED Triage Notes (Signed)
 Dry, itchy, painful scalp from hair dye 4 days ago.

## 2024-07-10 NOTE — ED Provider Notes (Signed)
 " [place] EMERGENCY DEPARTMENT AT [facility] Provider Note   CSN: [id number] Arrival date & time: 07/10/24  1018     Patient presents with: Rash   Julia George is a 59 y.o. female.    Rash    59 year old female presenting to the emergency department with concern for allergic reaction to hair dye and subsequent infection.  The patient states that she dyed her hair 4 days ago for the near holiday and subsequently had an allergic reaction on her scalp with itching and following this developed pain swelling and purulent drainage.  She feels that symptoms are worse around her right ear with skin thickening and redness.  No fevers or chills.  Drainage has slowed down some.  Prior to Admission medications  Medication Sig Start Date End Date Taking? Authorizing Provider  bacitracin  ointment Apply 1 Application topically 2 (two) times daily. 07/10/24  Yes Jerrol Agent, MD  doxycycline  (VIBRAMYCIN ) 100 MG capsule Take 1 capsule (100 mg total) by mouth 2 (two) times daily for 7 days. 07/10/24 07/17/24 Yes Jerrol Agent, MD  benzonatate  (TESSALON ) 100 MG capsule Take 1 capsule (100 mg total) by mouth 3 (three) times daily as needed for cough. 06/27/24   Hinnant, Collin F, PA-C  prednisoLONE acetate (PRED FORTE) 1 % ophthalmic suspension 1 drop 3 (three) times daily. 07/17/21   [provider]    Allergies: Patient has no known allergies.    Review of Systems  Skin:  Positive for rash.  All other systems reviewed and are negative.   Updated Vital Signs BP 123/81 (BP Location: Right Wrist)   Pulse (!) 51   Temp 98 F (36.7 C) (Oral)   Resp 17   LMP 03/24/2017   SpO2 100%   Physical Exam Vitals and nursing note reviewed.  Constitutional:      General: She is not in acute distress. HENT:     Head: Normocephalic and atraumatic.     Comments: Evidence of skin irritation and scalp cellulitis along the right ear with mild erythema, tenderness to palpation Eyes:      Conjunctiva/sclera: Conjunctivae normal.     Pupils: Pupils are equal, round, and reactive to light.  Cardiovascular:     Rate and Rhythm: Normal rate and regular rhythm.  Pulmonary:     Effort: Pulmonary effort is normal. No respiratory distress.  Abdominal:     General: There is no distension.     Tenderness: There is no guarding.  Musculoskeletal:        General: No deformity or signs of injury.     Cervical back: Neck supple.  Skin:    Findings: No lesion or rash.  Neurological:     General: No focal deficit present.     Mental Status: She is alert. Mental status is at baseline.     (all labs ordered are listed, but only abnormal results are displayed) Labs Reviewed - No data to display  EKG: None  Radiology: No results found.   Procedures   Medications Ordered in the ED - No data to display                                  Medical Decision Making Risk OTC drugs. Prescription drug management.    59 year old female presenting to the emergency department with concern for allergic reaction to hair dye and subsequent infection.  The patient states that she dyed her  hair 4 days ago for the near holiday and subsequently had an allergic reaction on her scalp with itching and following this developed pain swelling and purulent drainage.  She feels that symptoms are worse around her right ear with skin thickening and redness.  No fevers or chills.  Drainage has slowed down some.  On arrival, the patient was afebrile, not tachycardic heart rate 51, respirate 17, BP 123/81, saturating her percent on room air.  On exam the patient had evidence of scalp irritation either from allergic reaction or possibly caustic injury to the scalp.  Superimposed cellulitis appears to have developed with evidence of skin thickening, erythema, tenderness to palpation, no fluctuance to suggest abscess.  Patient is vitally stable, low concern for sepsis.  Will treat with a course of topical  bacitracin  in addition to oral doxycycline .  Patient provided with return precautions, overall stable for discharge and outpatient follow-up.     Final diagnoses:  Allergic reaction to hair dye  Cellulitis of scalp    ED Discharge Orders          Ordered    bacitracin  ointment  2 times daily        07/10/24 1105    doxycycline  (VIBRAMYCIN ) 100 MG capsule  2 times daily        07/10/24 1105               Jerrol Agent, MD 07/10/24 1108  "

## 2024-07-10 NOTE — ED Notes (Signed)
 Discharge instructions reviewed with patient. Patient verbalizes understanding, no further questions at this time. Medications/prescriptions and follow up information provided. No acute distress noted at time of departure.
